# Patient Record
Sex: Male | Born: 1985 | Race: White | Hispanic: No | Marital: Single | Smoking: Current every day smoker
Health system: Southern US, Community
[De-identification: ages and names within clinical notes are randomized; demographics above are authoritative.]

---

## 2014-12-29 ENCOUNTER — Emergency Department (HOSPITAL_COMMUNITY)
Admission: EM | Admit: 2014-12-29 | Discharge: 2014-12-29 | Discharge status: Against Medical Advice | Payer: Self-pay | Attending: Emergency Medicine | Admitting: Emergency Medicine

## 2014-12-29 ENCOUNTER — Emergency Department (HOSPITAL_COMMUNITY): Payer: Self-pay

## 2014-12-29 ENCOUNTER — Encounter (HOSPITAL_COMMUNITY): Payer: Self-pay | Admitting: *Deleted

## 2014-12-29 DIAGNOSIS — R002 Palpitations: Secondary | ICD-10-CM | POA: Insufficient documentation

## 2014-12-29 DIAGNOSIS — R55 Syncope and collapse: Secondary | ICD-10-CM | POA: Insufficient documentation

## 2014-12-29 DIAGNOSIS — Z720 Tobacco use: Secondary | ICD-10-CM | POA: Insufficient documentation

## 2014-12-29 LAB — COMPREHENSIVE METABOLIC PANEL
ALBUMIN: 4.2 g/dL (ref 3.5–5.0)
ALK PHOS: 60 U/L (ref 38–126)
ALT: 21 U/L (ref 17–63)
ANION GAP: 7 (ref 5–15)
AST: 21 U/L (ref 15–41)
BUN: 13 mg/dL (ref 6–20)
CHLORIDE: 107 mmol/L (ref 101–111)
CO2: 25 mmol/L (ref 22–32)
Calcium: 9.1 mg/dL (ref 8.9–10.3)
Creatinine, Ser: 1.09 mg/dL (ref 0.61–1.24)
GFR calc Af Amer: >60 mL/min (ref >60)
GFR calc non Af Amer: >60 mL/min (ref >60)
Glucose, Bld: 144 mg/dL — ABNORMAL HIGH (ref 65–99)
POTASSIUM: 3.9 mmol/L (ref 3.5–5.1)
Sodium: 139 mmol/L (ref 135–145)
Total Bilirubin: 0.5 mg/dL (ref 0.3–1.2)
Total Protein: 6.3 g/dL — ABNORMAL LOW (ref 6.5–8.1)

## 2014-12-29 LAB — CBC
HCT: 46.5 % (ref 39.0–52.0)
HEMOGLOBIN: 15.7 g/dL (ref 13.0–17.0)
MCH: 30.1 pg (ref 26.0–34.0)
MCHC: 33.8 g/dL (ref 30.0–36.0)
MCV: 89.3 fL (ref 78.0–100.0)
Platelets: 268 10*3/uL (ref 150–400)
RBC: 5.21 MIL/uL (ref 4.22–5.81)
RDW: 13.4 % (ref 11.5–15.5)
WBC: 10.2 10*3/uL (ref 4.0–10.5)

## 2014-12-29 LAB — I-STAT TROPONIN, ED: TROPONIN I, POC: 0 ng/mL (ref 0.00–0.08)

## 2014-12-29 MED ORDER — SODIUM CHLORIDE 0.9 % IV BOLUS (SEPSIS)
1000.0000 mL | Freq: Once | INTRAVENOUS | Status: AC
  Administered 2014-12-29: 1000 mL via INTRAVENOUS

## 2014-12-29 NOTE — Discharge Instructions (Signed)
Return here as needed.  Follow-up with your primary care doctor °

## 2014-12-29 NOTE — ED Notes (Signed)
Patient has decided to leave against medical advice despite being informed of the need for further testing.  Louis Owens Lawyer, [place] has discussed the risks and benefits of this decision with the patient and patient still would like to leave immediately.   Patient currently denies cp and dizziness, shortness of breath.

## 2014-12-29 NOTE — ED Notes (Signed)
Pt reports while on lunch break at work began to have chest pain with dizziness. States it felt like a throbbing in his chest. Shortness of breath. Vomit x 1. 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 HR 76 in triage.

## 2015-01-13 NOTE — ED Provider Notes (Signed)
CSN: [id number]     Arrival date & time 12/29/14  1420 History   First MD Initiated Contact with Patient 12/29/14 1636     Chief Complaint  Patient presents with  . Chest Pain     (Consider location/radiation/quality/duration/timing/severity/associated sxs/prior Treatment) HPI Patient presents to the emergency department with chest pain that started while he was at work.  The patient states that also felt dizzy and felt like he might pass out.  Patient states that he did have some mild shortness of breath, like there is throbbing in his chest.  He vomited times one.  Patient states that he has been seen in the past and had an incomplete right bundle branch block, states he did not have any headache, blurred vision, back pain, neck pain, fever, cough, dysuria, incontinence, seizure activity, numbness, abdominal pain or syncope.  The patient states that nothing seems make his condition, better or worse.  The patient states that he does work outside and was very hot outside but does not know if  that was the cause of the problem History reviewed. No pertinent past medical history. History reviewed. No pertinent past surgical history. No family history on file. History  Substance Use Topics  . Smoking status: Current Every Day Smoker  . Smokeless tobacco: Not on file  . Alcohol Use: Yes    Review of Systems  All other systems negative except as documented in the HPI. All pertinent positives and negatives as reviewed in the HPI.  Allergies  Review of patient's allergies indicates no known allergies.  Home Medications   Prior to Admission medications   Medication Sig Start Date End Date Taking? Authorizing Provider  calcium carbonate (TUMS - DOSED IN MG ELEMENTAL CALCIUM) 500 MG chewable tablet Chew 1 tablet by mouth daily as needed for indigestion or heartburn.   Yes Historical Provider, MD  ranitidine (ZANTAC) 300 MG tablet Take 300 mg by mouth at bedtime.   Yes Historical Provider, MD    BP 126/63 mmHg  Pulse 49  Temp(Src) 98 F (36.7 C)  Resp 22  SpO2 100% Physical Exam  Constitutional: He is oriented to person, place, and time. He appears well-developed and well-nourished. No distress.  HENT:  Head: Normocephalic and atraumatic.  Mouth/Throat: Oropharynx is clear and moist.  Eyes: Pupils are equal, round, and reactive to light.  Neck: Normal range of motion. Neck supple.  Cardiovascular: Normal rate, regular rhythm and normal heart sounds.  Exam reveals no gallop and no friction rub.   No murmur heard. Pulmonary/Chest: Effort normal and breath sounds normal. No respiratory distress.  Musculoskeletal: He exhibits no edema.  Neurological: He is alert and oriented to person, place, and time. He exhibits normal muscle tone. Coordination normal.  Skin: Skin is warm and dry. No rash noted. No erythema.  Nursing note and vitals reviewed.   ED Course  Procedures (including critical care time) Labs Review Labs Reviewed  COMPREHENSIVE METABOLIC PANEL - Abnormal; Notable for the following:    Glucose, Bld 144 (*)    Total Protein 6.3 (*)    All other components within normal limits  CBC  I-STAT TROPOININ, ED    Imaging Review No results found.   EKG Interpretation   Date/Time:  Wednesday December 29 2014 14:28:02 EDT Ventricular Rate:  76 PR Interval:  186 QRS Duration: 106 QT Interval:  358 QTC Calculation: 402 R Axis:   97 Text Interpretation:  Normal sinus rhythm Rightward axis Incomplete right  bundle branch block Borderline  ECG No old tracing to compare Confirmed by  [facility]  MD-J, JON [phone]) on 12/29/2014 4:29:41 PM      MDM   Final diagnoses:  Heart palpitations  Near syncope      The patient would like to leave AGAINST MEDICAL ADVICE.  We did want to get a second troponin on him.  I explained to the patient that his condition could worsen or change and that could lead to further problems including death.  The patient voiced an understanding.   He states he did still like to go home and follow up with his primary care doctor.  Charlestine Night, PA-C 01/13/15 1914  Linwood Dibbles, MD 01/13/15 1520

## 2016-09-12 IMAGING — DX DG CHEST 2V
2 series · 2 of 2 positions shown · non-contrast
Comparison: None.

CLINICAL DATA: Dizziness in chest pain. History of smoking.
Shortness of breath.

EXAM:
CHEST  2 VIEW

[chest pa]
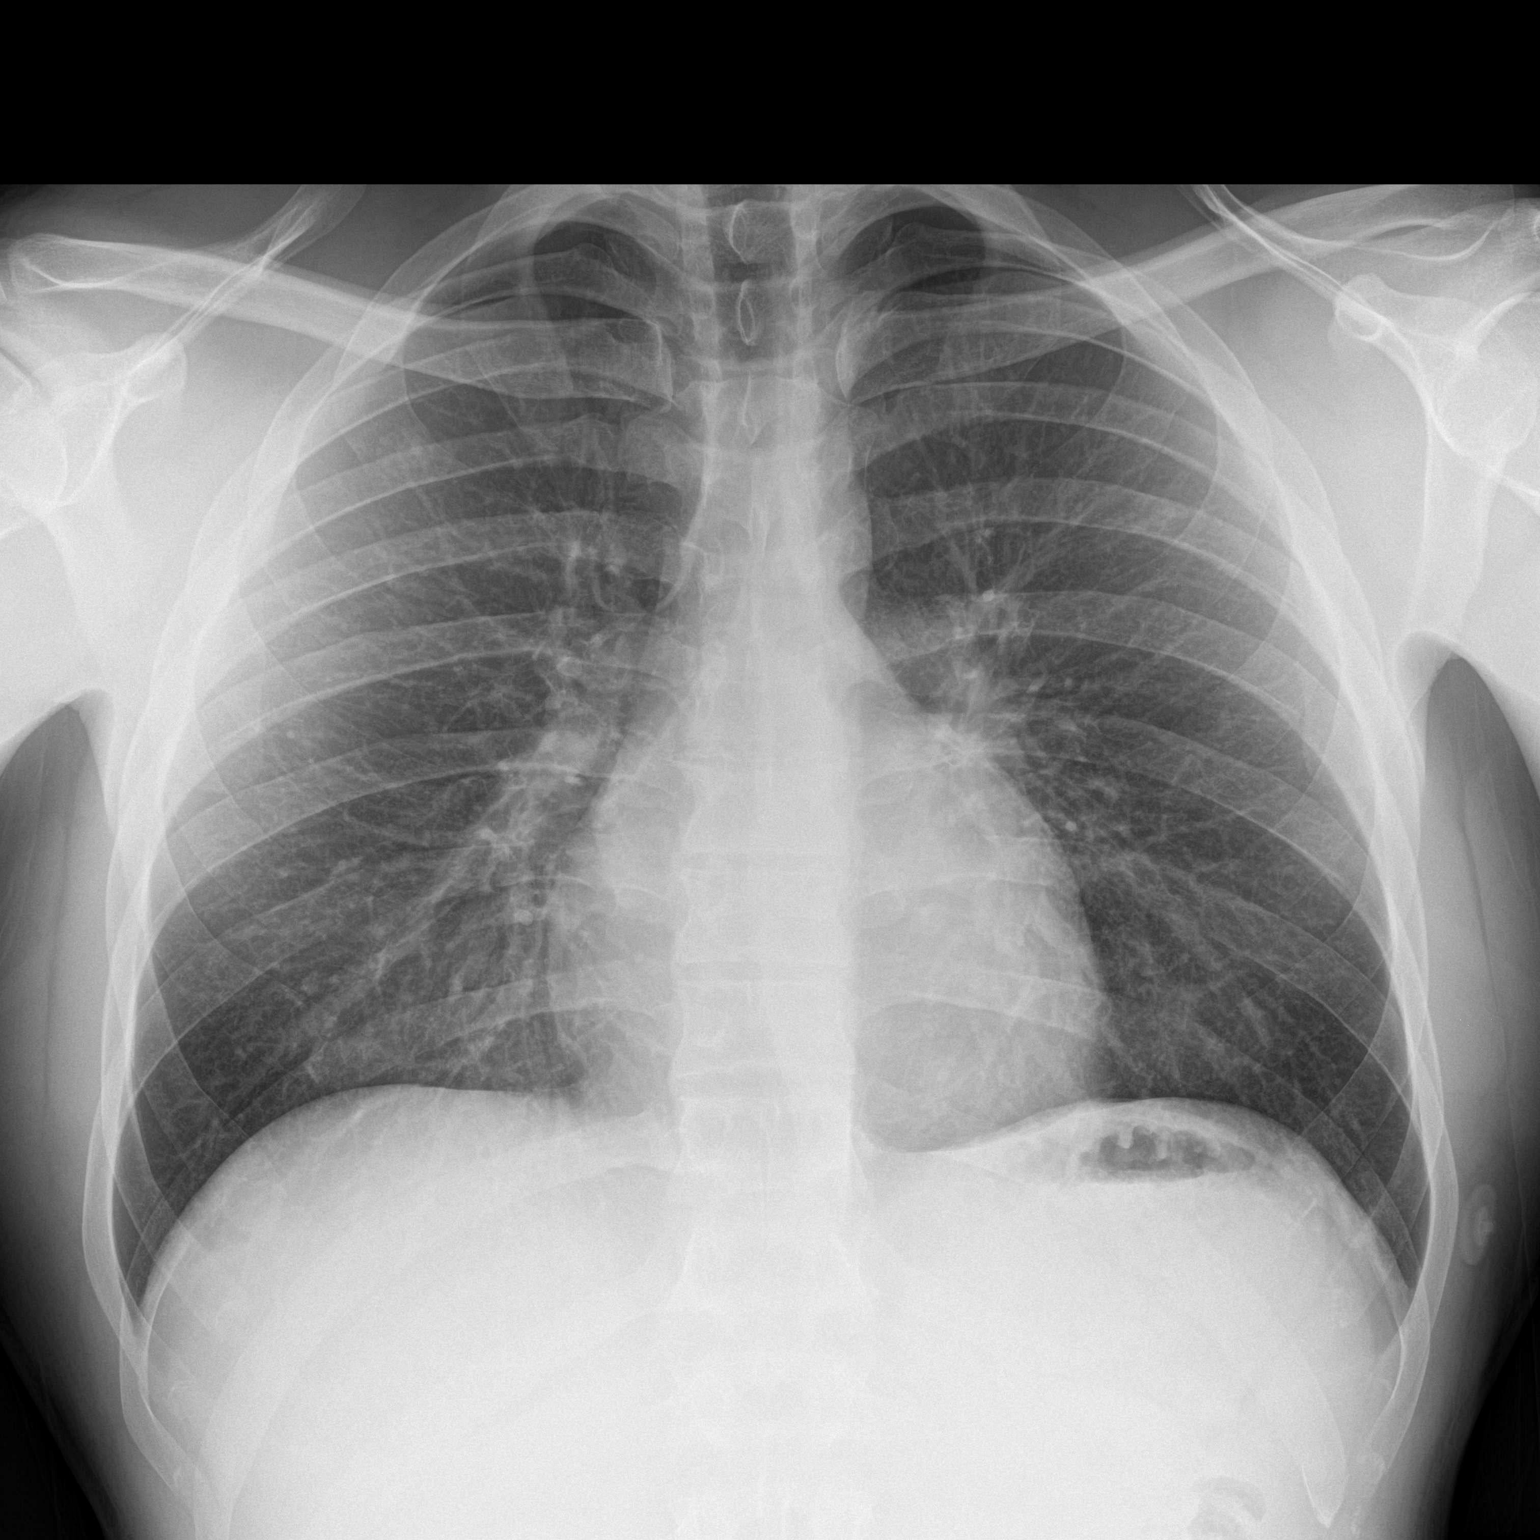

[chest lat]
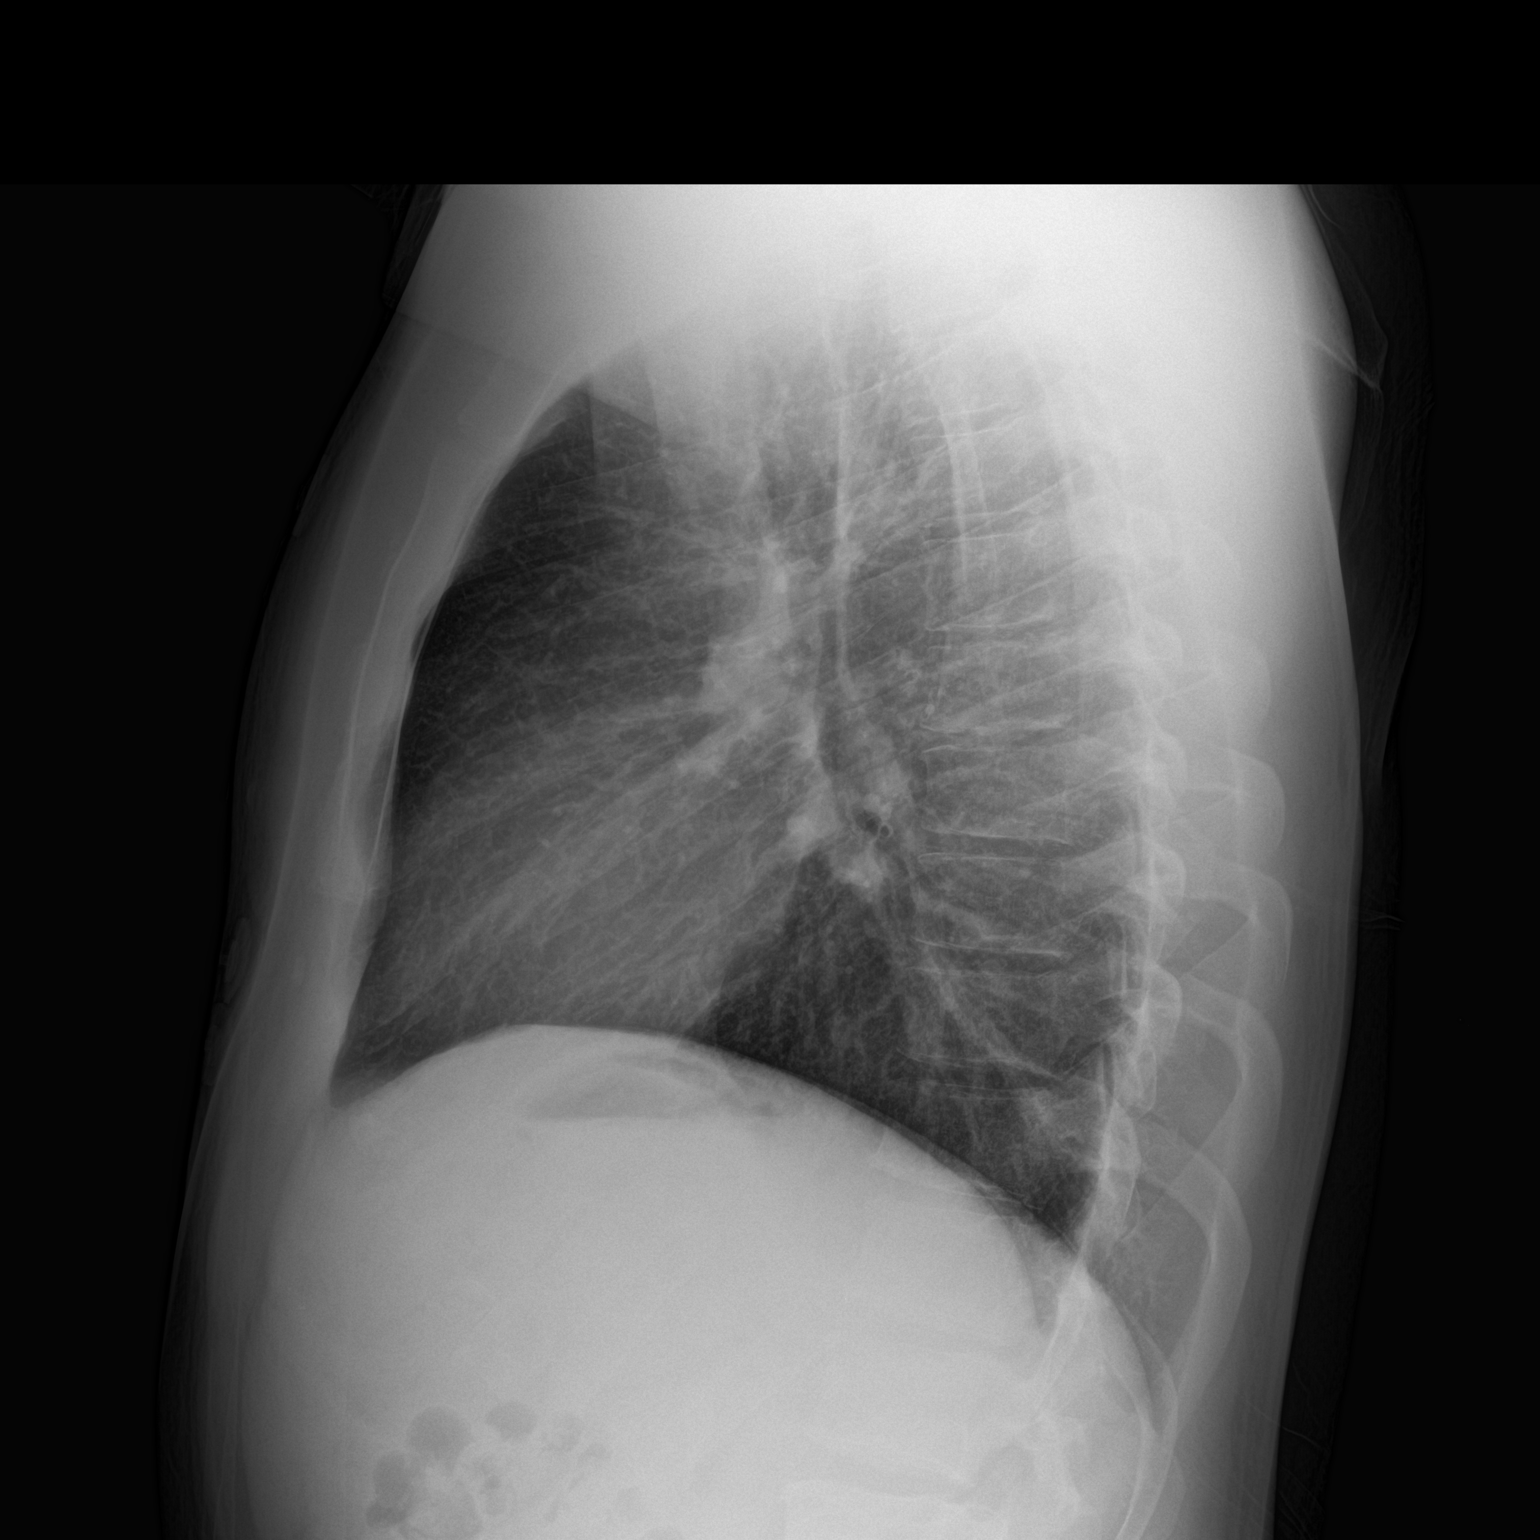

[2 of 2 positions shown; findings below may reference images not displayed]

FINDINGS: Normal cardiac silhouette and mediastinal contours. There is mild
diffuse slightly nodular thickening of the pulmonary interstitium.
No discrete focal airspace opacities. No pleural effusion or
pneumothorax. No evidence of edema. No acute osseus abnormalities.
IMPRESSION: Findings suggestive of airways disease / bronchitis. No focal
airspace opacities to suggest pneumonia.
